# Patient Record
Sex: Female | Born: 2000
Health system: Southern US, Community
[De-identification: ages and names within clinical notes are randomized; demographics above are authoritative.]

---
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
## 2019-04-01 DIAGNOSIS — Z114 Encounter for screening for human immunodeficiency virus [HIV]: Secondary | ICD-10-CM | POA: Diagnosis not present

## 2019-04-01 DIAGNOSIS — Z01419 Encounter for gynecological examination (general) (routine) without abnormal findings: Secondary | ICD-10-CM | POA: Diagnosis not present

## 2019-04-01 DIAGNOSIS — Z1159 Encounter for screening for other viral diseases: Secondary | ICD-10-CM | POA: Diagnosis not present

## 2019-04-01 DIAGNOSIS — N898 Other specified noninflammatory disorders of vagina: Secondary | ICD-10-CM | POA: Diagnosis not present

## 2019-04-01 DIAGNOSIS — Z6828 Body mass index (BMI) 28.0-28.9, adult: Secondary | ICD-10-CM | POA: Diagnosis not present

## 2019-04-01 DIAGNOSIS — Z113 Encounter for screening for infections with a predominantly sexual mode of transmission: Secondary | ICD-10-CM | POA: Diagnosis not present

## 2019-04-01 DIAGNOSIS — Z118 Encounter for screening for other infectious and parasitic diseases: Secondary | ICD-10-CM | POA: Diagnosis not present

## 2019-04-15 DIAGNOSIS — Z3042 Encounter for surveillance of injectable contraceptive: Secondary | ICD-10-CM | POA: Diagnosis not present

## 2019-07-07 DIAGNOSIS — Z3042 Encounter for surveillance of injectable contraceptive: Secondary | ICD-10-CM | POA: Diagnosis not present

## 2019-08-01 ENCOUNTER — Other Ambulatory Visit: Payer: Self-pay

## 2019-08-01 ENCOUNTER — Encounter (HOSPITAL_COMMUNITY): Payer: Self-pay

## 2019-08-01 ENCOUNTER — Ambulatory Visit (HOSPITAL_COMMUNITY)
Admission: EM | Admit: 2019-08-01 | Discharge: 2019-08-01 | Disposition: A | Discharge status: Home / Self Care | Payer: BC Managed Care – PPO | Attending: Emergency Medicine | Admitting: Emergency Medicine

## 2019-08-01 DIAGNOSIS — R3989 Other symptoms and signs involving the genitourinary system: Secondary | ICD-10-CM | POA: Diagnosis not present

## 2019-08-01 DIAGNOSIS — Z3202 Encounter for pregnancy test, result negative: Secondary | ICD-10-CM | POA: Diagnosis not present

## 2019-08-01 DIAGNOSIS — N898 Other specified noninflammatory disorders of vagina: Secondary | ICD-10-CM

## 2019-08-01 LAB — POCT URINALYSIS DIP (DEVICE)
Bilirubin Urine: NEGATIVE
Glucose, UA: NEGATIVE mg/dL
Ketones, ur: 40 mg/dL — AB
Nitrite: NEGATIVE
Protein, ur: 30 mg/dL — AB
Specific Gravity, Urine: >=1.03 (ref 1.005–1.030)
Urobilinogen, UA: 0.2 mg/dL (ref 0.0–1.0)
pH: 7 (ref 5.0–8.0)

## 2019-08-01 LAB — POC URINE PREG, ED: Preg Test, Ur: NEGATIVE

## 2019-08-01 LAB — POCT PREGNANCY, URINE: Preg Test, Ur: NEGATIVE

## 2019-08-01 MED ORDER — LIDOCAINE HCL 2 % EX GEL: 1.0000 "application " | CUTANEOUS | 0 refills | Status: DC | PRN

## 2019-08-01 MED ORDER — AZITHROMYCIN 250 MG PO TABS
1000.0000 mg | ORAL_TABLET | Freq: Once | ORAL | Status: AC
  Administered 2019-08-01: 1000 mg via ORAL

## 2019-08-01 MED ORDER — METRONIDAZOLE 500 MG PO TABS: 500.0000 mg | ORAL_TABLET | Freq: Two times a day (BID) | ORAL | 0 refills | Status: AC

## 2019-08-01 MED ORDER — AZITHROMYCIN 250 MG PO TABS
ORAL_TABLET | ORAL | Status: AC
  Filled 2019-08-01: qty 1

## 2019-08-01 MED ORDER — CEFTRIAXONE SODIUM 250 MG IJ SOLR
INTRAMUSCULAR | Status: AC
  Filled 2019-08-01: qty 250

## 2019-08-01 MED ORDER — CEFTRIAXONE SODIUM 250 MG IJ SOLR
250.0000 mg | Freq: Once | INTRAMUSCULAR | Status: AC
  Administered 2019-08-01: 250 mg via INTRAMUSCULAR

## 2019-08-01 MED ORDER — VALACYCLOVIR HCL 1 G PO TABS: 1000.0000 mg | ORAL_TABLET | Freq: Three times a day (TID) | ORAL | 0 refills | Status: AC

## 2019-08-01 NOTE — Discharge Instructions (Signed)
We have treated you today for gonorrhea and chlamydia, with Rocephin and azithromycin.  Begin taking metronidazole twice daily for the next week to treat for bacterial vaginosis as well as trichomonas.  Please refrain from sexual intercourse for 7 days while medicines eliminating infection.   Genital lesions concerning for herpes.  Begin Valtrex 3 times a day for the next 2 weeks.  Please follow-up with OB/GYN for further discussion of suppressive therapy.  We are testing you for HSV, Gonorrhea, Chlamydia, Trichomonas, Yeast and Bacterial Vaginosis. We will call you if anything is positive and let you know if you require any further treatment. Please inform partners of any positive results.   Please return if symptoms not improving with treatment, development of fever, nausea, vomiting, abdominal pain.

## 2019-08-01 NOTE — ED Triage Notes (Signed)
Pt states having painful blisters in the perineum x 4 days. Reports having light brown vaginal discharge x 2 days.

## 2019-08-02 ENCOUNTER — Telehealth (HOSPITAL_COMMUNITY): Payer: Self-pay | Admitting: Emergency Medicine

## 2019-08-02 MED ORDER — CEPHALEXIN 500 MG PO CAPS: 500.0000 mg | ORAL_CAPSULE | Freq: Two times a day (BID) | ORAL | 0 refills | Status: AC

## 2019-08-02 NOTE — ED Provider Notes (Signed)
[facility]    CSN: [id number] Arrival date & time: 08/01/19  1313      History   Chief Complaint Chief Complaint  Patient presents with  . blisters    HPI Anne Walls is a 18 y.o. female no significant past medical history presenting today for evaluation of discharge and genital blisters.  Patient states that over the past 4 days she has developed ulcers/blisters to her genital area.  States that initially she had some burning with urination and initially thought this was from a rash, but since has developed more prominent lesions that are associated with pain.  She denies increasing urinary frequency or incomplete voiding.  Over the past couple days she has also noticed a tan watery discharge.  She denies any abdominal pain nausea or vomiting.  Last menstrual cycle was November 20 6-12/2.  Patient is on birth control, uses Depo-Provera.  Last injection was December 2.  HPI  History reviewed. No pertinent past medical history.  There are no problems to display for this patient.   History reviewed. No pertinent surgical history.  OB History   No obstetric history on file.      Home Medications    Prior to Admission medications   Medication Sig Start Date End Date Taking? Authorizing Provider  medroxyPROGESTERone (DEPO-PROVERA) 150 MG/ML injection Inject 150 mg into the muscle every 3 (three) months.   Yes [provider]  lidocaine (XYLOCAINE) 2 % jelly Apply 1 application topically as needed. 08/01/19   Jaslyn Bansal C, PA-C  metroNIDAZOLE (FLAGYL) 500 MG tablet Take 1 tablet (500 mg total) by mouth 2 (two) times daily for 7 days. 08/01/19 08/08/19  Tag Wurtz C, PA-C  valACYclovir (VALTREX) 1000 MG tablet Take 1 tablet (1,000 mg total) by mouth 3 (three) times daily for 14 days. 08/01/19 08/15/19  Prabhjot Piscitello, Junius Creamer, PA-C    Family History Family History  Problem Relation Age of Onset  . Healthy Mother   . Healthy Father     Social  History Social History   Tobacco Use  . Smoking status: Never Smoker  . Smokeless tobacco: Never Used  Substance Use Topics  . Alcohol use: Never  . Drug use: Never     Allergies   Penicillins   Review of Systems Review of Systems  Constitutional: Negative for fever.  Respiratory: Negative for shortness of breath.   Cardiovascular: Negative for chest pain.  Gastrointestinal: Negative for abdominal pain, diarrhea, nausea and vomiting.  Genitourinary: Positive for dysuria, genital sores and vaginal discharge. Negative for flank pain, hematuria, menstrual problem, vaginal bleeding and vaginal pain.  Musculoskeletal: Negative for back pain.  Skin: Negative for rash.  Neurological: Negative for dizziness, light-headedness and headaches.     Physical Exam Triage Vital Signs ED Triage Vitals  Enc Vitals Group     BP 08/01/19 1425 130/80     Pulse Rate 08/01/19 1425 (!) 104     Resp 08/01/19 1425 16     Temp 08/01/19 1425 99.9 F (37.7 C)     Temp Source 08/01/19 1425 Oral     SpO2 08/01/19 1425 100 %     Weight --      Height --      Head Circumference --      Peak Flow --      Pain Score 08/01/19 1423 10     Pain Loc --      Pain Edu? --      Excl. in GC? --  No data found.  Updated Vital Signs BP 130/80 (BP Location: Right Arm)   Pulse (!) 104   Temp 99.9 F (37.7 C) (Oral)   Resp 16   SpO2 100%   Visual Acuity Right Eye Distance:   Left Eye Distance:   Bilateral Distance:    Right Eye Near:   Left Eye Near:    Bilateral Near:     Physical Exam Vitals and nursing note reviewed.  Constitutional:      General: She is not in acute distress.    Appearance: She is well-developed.  HENT:     Head: Normocephalic and atraumatic.  Eyes:     Conjunctiva/sclera: Conjunctivae normal.  Cardiovascular:     Rate and Rhythm: Normal rate and regular rhythm.     Heart sounds: No murmur.  Pulmonary:     Effort: Pulmonary effort is normal. No respiratory  distress.     Breath sounds: Normal breath sounds.  Abdominal:     Palpations: Abdomen is soft.     Tenderness: There is no abdominal tenderness.     Comments: Soft, nondistended, nontender to palpation throughout abdomen  Genitourinary:    Comments: Normal external female genitalia, multiple ulcerative lesions to perineal area, lower labia majora as well as extending into bilateral gluteal/perirectal areas  Watery brownish discharge noted in vagina, vaginal mucosa pink, cervix is slightly erythematous and friable with obtaining swab, mild discomfort with bimanual, no cervical motion tenderness, no adnexal masses palpated Musculoskeletal:     Cervical back: Neck supple.  Skin:    General: Skin is warm and dry.  Neurological:     Mental Status: She is alert.      UC Treatments / Results  Labs (all labs ordered are listed, but only abnormal results are displayed) Labs Reviewed  POCT URINALYSIS DIP (DEVICE) - Abnormal; Notable for the following components:      Result Value   Ketones, ur 40 (*)    Hgb urine dipstick SMALL (*)    Protein, ur 30 (*)    Leukocytes,Ua TRACE (*)    All other components within normal limits  HSV CULTURE AND TYPING  URINE CULTURE  POC URINE PREG, ED  POCT PREGNANCY, URINE  CERVICOVAGINAL ANCILLARY ONLY    EKG   Radiology No results found.  Procedures Procedures (including critical care time)  Medications Ordered in UC Medications  cefTRIAXone (ROCEPHIN) injection 250 mg (250 mg Intramuscular Given 08/01/19 1536)  azithromycin (ZITHROMAX) tablet 1,000 mg (1,000 mg Oral Given 08/01/19 1537)    Initial Impression / Assessment and Plan / UC Course  I have reviewed the triage vital signs and the nursing notes.  Pertinent labs & imaging results that were available during my care of the patient were reviewed by me and considered in my medical decision making (see chart for details).     Exam suggestive of likely HSV infection causing sores,  initiating on Valtrex and HSV swab pending.  Provided Xylocaine jelly to apply topically to help with discomfort.  Given presentation and also empirically treating for gonorrhea and chlamydia today and continuing on metronidazole twice daily x1 week for full coverage of STDs.  Cervicovaginal swab pending.  Will call with results and provide further treatment as needed.  Advised to follow-up with OB/GYN for further treatment/management of HSV.  Discussed strict return precautions. Patient verbalized understanding and is agreeable with plan.  Final Clinical Impressions(s) / UC Diagnoses   Final diagnoses:  Vaginal discharge  Genital sore  Discharge Instructions     We have treated you today for gonorrhea and chlamydia, with Rocephin and azithromycin.  Begin taking metronidazole twice daily for the next week to treat for bacterial vaginosis as well as trichomonas.  Please refrain from sexual intercourse for 7 days while medicines eliminating infection.   Genital lesions concerning for herpes.  Begin Valtrex 3 times a day for the next 2 weeks.  Please follow-up with OB/GYN for further discussion of suppressive therapy.  We are testing you for HSV, Gonorrhea, Chlamydia, Trichomonas, Yeast and Bacterial Vaginosis. We will call you if anything is positive and let you know if you require any further treatment. Please inform partners of any positive results.   Please return if symptoms not improving with treatment, development of fever, nausea, vomiting, abdominal pain.    ED Prescriptions    Medication Sig Dispense Auth. Provider   valACYclovir (VALTREX) 1000 MG tablet Take 1 tablet (1,000 mg total) by mouth 3 (three) times daily for 14 days. 42 tablet Borden Thune C, PA-C   metroNIDAZOLE (FLAGYL) 500 MG tablet Take 1 tablet (500 mg total) by mouth 2 (two) times daily for 7 days. 14 tablet Charnay Nazario C, PA-C   lidocaine (XYLOCAINE) 2 % jelly Apply 1 application topically as needed. 30  mL Savoy Som[place], Sierra BrooksHallie C, PA-C     PDMP not reviewed this encounter.   Lew DawesWieters, Idabell Picking C, [place]PA-C 08/02/19 [phone]

## 2019-08-02 NOTE — Telephone Encounter (Signed)
Keflex sent for positive culture of strep, twice daily for 1 week. Has unknown childhood reaction to PCN and tolerated ceftriaxone recently will proceed with cephalosporins. Vaginal swab pending.

## 2019-08-03 ENCOUNTER — Telehealth: Payer: Self-pay | Admitting: Emergency Medicine

## 2019-08-03 LAB — URINE CULTURE: Culture: >=100000 — AB

## 2019-08-03 NOTE — Telephone Encounter (Signed)
Attempted to reach patient. No answer at this time. Voicemail left.    

## 2019-08-04 LAB — CERVICOVAGINAL ANCILLARY ONLY
Bacterial vaginitis: NEGATIVE
Candida vaginitis: NEGATIVE
Chlamydia: POSITIVE — AB
Neisseria Gonorrhea: NEGATIVE
Trichomonas: NEGATIVE

## 2019-08-04 LAB — HSV CULTURE AND TYPING

## 2019-08-05 ENCOUNTER — Emergency Department (HOSPITAL_COMMUNITY)
Admission: EM | Admit: 2019-08-05 | Discharge: 2019-08-06 | Disposition: A | Discharge status: Home / Self Care | Payer: BC Managed Care – PPO | Attending: Emergency Medicine | Admitting: Emergency Medicine

## 2019-08-05 ENCOUNTER — Telehealth: Payer: Self-pay | Admitting: Emergency Medicine

## 2019-08-05 ENCOUNTER — Encounter (HOSPITAL_COMMUNITY): Payer: Self-pay | Admitting: Emergency Medicine

## 2019-08-05 ENCOUNTER — Other Ambulatory Visit: Payer: Self-pay

## 2019-08-05 ENCOUNTER — Emergency Department (HOSPITAL_COMMUNITY): Payer: BC Managed Care – PPO

## 2019-08-05 DIAGNOSIS — M25531 Pain in right wrist: Secondary | ICD-10-CM | POA: Diagnosis not present

## 2019-08-05 DIAGNOSIS — M79641 Pain in right hand: Secondary | ICD-10-CM | POA: Diagnosis not present

## 2019-08-05 DIAGNOSIS — Y999 Unspecified external cause status: Secondary | ICD-10-CM | POA: Diagnosis not present

## 2019-08-05 DIAGNOSIS — S0083XA Contusion of other part of head, initial encounter: Secondary | ICD-10-CM | POA: Diagnosis not present

## 2019-08-05 DIAGNOSIS — Y929 Unspecified place or not applicable: Secondary | ICD-10-CM | POA: Diagnosis not present

## 2019-08-05 DIAGNOSIS — Y939 Activity, unspecified: Secondary | ICD-10-CM | POA: Diagnosis not present

## 2019-08-05 DIAGNOSIS — R600 Localized edema: Secondary | ICD-10-CM | POA: Diagnosis not present

## 2019-08-05 DIAGNOSIS — S60211A Contusion of right wrist, initial encounter: Secondary | ICD-10-CM | POA: Diagnosis not present

## 2019-08-05 DIAGNOSIS — S0990XA Unspecified injury of head, initial encounter: Secondary | ICD-10-CM | POA: Diagnosis not present

## 2019-08-05 DIAGNOSIS — Z79899 Other long term (current) drug therapy: Secondary | ICD-10-CM | POA: Diagnosis not present

## 2019-08-05 DIAGNOSIS — S6991XA Unspecified injury of right wrist, hand and finger(s), initial encounter: Secondary | ICD-10-CM | POA: Diagnosis not present

## 2019-08-05 DIAGNOSIS — S60221A Contusion of right hand, initial encounter: Secondary | ICD-10-CM | POA: Diagnosis not present
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
## 2019-08-05 NOTE — Telephone Encounter (Signed)
Chlamydia is positive.  This was treated at the urgent care visit with po zithromax 1g.  Pt needs education to please refrain from sexual intercourse for 7 days to give the medicine time to work.  Sexual partners need to be notified and tested/treated.  Condoms may reduce risk of reinfection.  Recheck or followup with PCP for further evaluation if symptoms are not improving.  [facility] notified.  HSV culture shows type 1. Pt given valtrex  Pt c/o burning with urination still, informed her of medicine at pharmacy. Patient contacted by phone and made aware of    results. Pt verbalized understanding and had all questions answered.

## 2019-08-06 ENCOUNTER — Emergency Department (HOSPITAL_COMMUNITY): Payer: BC Managed Care – PPO

## 2019-08-06 DIAGNOSIS — S0990XA Unspecified injury of head, initial encounter: Secondary | ICD-10-CM | POA: Diagnosis not present

## 2019-08-06 DIAGNOSIS — S60221A Contusion of right hand, initial encounter: Secondary | ICD-10-CM | POA: Diagnosis not present

## 2019-08-06 DIAGNOSIS — S6991XA Unspecified injury of right wrist, hand and finger(s), initial encounter: Secondary | ICD-10-CM | POA: Diagnosis not present

## 2019-08-06 DIAGNOSIS — M79641 Pain in right hand: Secondary | ICD-10-CM | POA: Diagnosis not present

## 2019-08-06 MED ORDER — IBUPROFEN 200 MG PO TABS
600.0000 mg | ORAL_TABLET | Freq: Once | ORAL | Status: AC
  Administered 2019-08-06: 600 mg via ORAL
  Filled 2019-08-06: qty 1
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
## 2019-10-05 DIAGNOSIS — Z3042 Encounter for surveillance of injectable contraceptive: Secondary | ICD-10-CM | POA: Diagnosis not present

## 2019-12-31 DIAGNOSIS — Z3042 Encounter for surveillance of injectable contraceptive: Secondary | ICD-10-CM | POA: Diagnosis not present

## 2020-03-02 ENCOUNTER — Emergency Department (HOSPITAL_COMMUNITY): Payer: BC Managed Care – PPO

## 2020-03-02 ENCOUNTER — Encounter (HOSPITAL_COMMUNITY): Payer: Self-pay

## 2020-03-02 ENCOUNTER — Emergency Department (HOSPITAL_COMMUNITY)
Admission: EM | Admit: 2020-03-02 | Discharge: 2020-03-02 | Disposition: A | Discharge status: Home / Self Care | Payer: BC Managed Care – PPO | Attending: Emergency Medicine | Admitting: Emergency Medicine

## 2020-03-02 DIAGNOSIS — Y999 Unspecified external cause status: Secondary | ICD-10-CM | POA: Insufficient documentation

## 2020-03-02 DIAGNOSIS — Y929 Unspecified place or not applicable: Secondary | ICD-10-CM | POA: Diagnosis not present

## 2020-03-02 DIAGNOSIS — Y939 Activity, unspecified: Secondary | ICD-10-CM | POA: Diagnosis not present

## 2020-03-02 DIAGNOSIS — S0181XA Laceration without foreign body of other part of head, initial encounter: Secondary | ICD-10-CM | POA: Insufficient documentation

## 2020-03-02 DIAGNOSIS — Z23 Encounter for immunization: Secondary | ICD-10-CM | POA: Insufficient documentation

## 2020-03-02 DIAGNOSIS — J322 Chronic ethmoidal sinusitis: Secondary | ICD-10-CM | POA: Diagnosis not present

## 2020-03-02 DIAGNOSIS — W010XXA Fall on same level from slipping, tripping and stumbling without subsequent striking against object, initial encounter: Secondary | ICD-10-CM | POA: Insufficient documentation

## 2020-03-02 DIAGNOSIS — J0120 Acute ethmoidal sinusitis, unspecified: Secondary | ICD-10-CM | POA: Diagnosis not present

## 2020-03-02 DIAGNOSIS — W19XXXA Unspecified fall, initial encounter: Secondary | ICD-10-CM | POA: Diagnosis not present

## 2020-03-02 DIAGNOSIS — Z79899 Other long term (current) drug therapy: Secondary | ICD-10-CM | POA: Insufficient documentation

## 2020-03-02 DIAGNOSIS — J320 Chronic maxillary sinusitis: Secondary | ICD-10-CM | POA: Diagnosis not present

## 2020-03-02 DIAGNOSIS — I10 Essential (primary) hypertension: Secondary | ICD-10-CM | POA: Diagnosis not present

## 2020-03-02 DIAGNOSIS — S0993XA Unspecified injury of face, initial encounter: Secondary | ICD-10-CM | POA: Diagnosis not present

## 2020-03-02 DIAGNOSIS — R58 Hemorrhage, not elsewhere classified: Secondary | ICD-10-CM | POA: Diagnosis not present

## 2020-03-02 MED ORDER — ACETAMINOPHEN 325 MG PO TABS
650.0000 mg | ORAL_TABLET | Freq: Once | ORAL | Status: AC
  Administered 2020-03-02: 650 mg via ORAL
  Filled 2020-03-02: qty 2

## 2020-03-02 MED ORDER — TETANUS-DIPHTH-ACELL PERTUSSIS 5-2.5-18.5 LF-MCG/0.5 IM SUSP
0.5000 mL | Freq: Once | INTRAMUSCULAR | Status: AC
Start: 2020-03-02 — End: 2020-03-02
  Administered 2020-03-02: 0.5 mL via INTRAMUSCULAR
  Filled 2020-03-02: qty 0.5
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
## 2020-03-02 NOTE — ED Notes (Signed)
Pt discharged from this ED in stable condition at this time. All discharge instructions and follow up care reviewed with pt with no further questions at this time. Pt ambulatory with steady gait, clear speech.  

## 2020-03-24 DIAGNOSIS — Z3042 Encounter for surveillance of injectable contraceptive: Secondary | ICD-10-CM | POA: Diagnosis not present

## 2020-03-25 DIAGNOSIS — L910 Hypertrophic scar: Secondary | ICD-10-CM | POA: Diagnosis not present

## 2020-04-09 DIAGNOSIS — L089 Local infection of the skin and subcutaneous tissue, unspecified: Secondary | ICD-10-CM | POA: Diagnosis not present

## 2020-04-09 DIAGNOSIS — L239 Allergic contact dermatitis, unspecified cause: Secondary | ICD-10-CM | POA: Diagnosis not present

## 2020-05-30 ENCOUNTER — Institutional Professional Consult (permissible substitution): Payer: BC Managed Care – PPO | Admitting: Plastic Surgery

## 2020-08-16 ENCOUNTER — Other Ambulatory Visit: Payer: BC Managed Care – PPO
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
## 2023-04-10 ENCOUNTER — Encounter (HOSPITAL_COMMUNITY): Payer: Self-pay

## 2023-04-10 ENCOUNTER — Ambulatory Visit (HOSPITAL_COMMUNITY)
Admission: EM | Admit: 2023-04-10 | Discharge: 2023-04-10 | Disposition: A | Discharge status: Home / Self Care | Payer: Managed Care, Other (non HMO) | Attending: Emergency Medicine | Admitting: Emergency Medicine

## 2023-04-10 DIAGNOSIS — L0501 Pilonidal cyst with abscess: Secondary | ICD-10-CM

## 2023-04-10 MED ORDER — IBUPROFEN 800 MG PO TABS: 800.0000 mg | ORAL_TABLET | Freq: Three times a day (TID) | ORAL | 0 refills | Status: AC

## 2023-04-10 MED ORDER — SULFAMETHOXAZOLE-TRIMETHOPRIM 800-160 MG PO TABS
1.0000 | ORAL_TABLET | Freq: Two times a day (BID) | ORAL | 0 refills | Status: DC
Start: 2023-04-10 — End: 2023-04-12

## 2023-04-10 MED ORDER — CHLORHEXIDINE GLUCONATE 4 % EX SOLN: Freq: Every day | CUTANEOUS | 0 refills | Status: AC | PRN

## 2023-04-10 NOTE — ED Provider Notes (Signed)
[facility]    CSN: [id number] Arrival date & time: 04/10/23  1039      History   Chief Complaint Chief Complaint  Patient presents with   Abscess    HPI Anne Walls is a 22 y.o. female.   Patient presents to clinic over concerns of a possible rectal abscess.  She noticed Friday that she had some discomfort with the gluteal cleft, send today the pain got much worse.  She noticed a hard firm area that she tried to drain herself, did not get any drainage from it.  She has been taken Tylenol as needed for pain.  Denies fevers, nausea or vomiting.  No drainage from the site.  Denies any history of abscesses to this area, or ever needing an incision and drainage.    The history is provided by the patient and medical records.  Abscess Associated symptoms: no fever, no nausea and no vomiting     History reviewed. No pertinent past medical history.  There are no problems to display for this patient.   History reviewed. No pertinent surgical history.  OB History   No obstetric history on file.      Home Medications    Prior to Admission medications   Medication Sig Start Date End Date Taking? Authorizing Provider  chlorhexidine (HIBICLENS) 4 % external liquid Apply topically daily as needed. 04/10/23  Yes Rinaldo Ratel, [place] N, FNP  ibuprofen (ADVIL) 800 MG tablet Take 1 tablet (800 mg total) by mouth 3 (three) times daily. 04/10/23  Yes Rinaldo Ratel, [place] N, FNP  sulfamethoxazole-trimethoprim (BACTRIM DS) 800-160 MG tablet Take 1 tablet by mouth 2 (two) times daily for 7 days. 04/10/23 04/17/23 Yes Rinaldo Ratel, [place] N, FNP  medroxyPROGESTERone (DEPO-PROVERA) 150 MG/ML injection Inject 150 mg into the muscle every 3 (three) months.    [provider]    Family History Family History  Problem Relation Age of Onset   Healthy Mother    Healthy Father     Social History Social History   Tobacco Use   Smoking status: Never   Smokeless tobacco: Never   Substance Use Topics   Alcohol use: Never   Drug use: Never     Allergies   Penicillins   Review of Systems Review of Systems  Constitutional:  Negative for fever.  Gastrointestinal:  Negative for nausea and vomiting.     Physical Exam Triage Vital Signs ED Triage Vitals  Encounter Vitals Group     BP 04/10/23 1148 125/79     Systolic BP Percentile --      Diastolic BP Percentile --      Pulse Rate 04/10/23 1148 99     Resp 04/10/23 1148 18     Temp 04/10/23 1148 98.6 F (37 C)     Temp Source 04/10/23 1148 Oral     SpO2 04/10/23 1148 98 %     Weight --      Height --      Head Circumference --      Peak Flow --      Pain Score 04/10/23 1150 10     Pain Loc --      Pain Education --      Exclude from Growth Chart --    No data found.  Updated Vital Signs BP 125/79 (BP Location: Right Arm)   Pulse 99   Temp 98.6 F (37 C) (Oral)   Resp 18   SpO2 98%   Visual Acuity Right Eye  Distance:   Left Eye Distance:   Bilateral Distance:    Right Eye Near:   Left Eye Near:    Bilateral Near:     Physical Exam Vitals and nursing note reviewed.  Constitutional:      Appearance: Normal appearance.  HENT:     Head: Normocephalic and atraumatic.     Right Ear: External ear normal.     Left Ear: External ear normal.     Nose: Nose normal.     Mouth/Throat:     Mouth: Mucous membranes are moist.  Eyes:     Conjunctiva/sclera: Conjunctivae normal.  Cardiovascular:     Rate and Rhythm: Normal rate.  Pulmonary:     Effort: Pulmonary effort is normal. No respiratory distress.  Genitourinary:      Comments: Indurated abscess to left side of gluteal cleft.  No drainage.  Area is tender to palpation, no erythema noted. Musculoskeletal:        General: Normal range of motion.  Skin:    General: Skin is warm and dry.  Neurological:     General: No focal deficit present.     Mental Status: She is alert and oriented to person, place, and time.  Psychiatric:         Mood and Affect: Mood normal.        Behavior: Behavior normal. Behavior is cooperative.      UC Treatments / Results  Labs (all labs ordered are listed, but only abnormal results are displayed) Labs Reviewed - No data to display  EKG   Radiology No results found.  Procedures Procedures (including critical care time)  Medications Ordered in UC Medications - No data to display  Initial Impression / Assessment and Plan / UC Course  I have reviewed the triage vital signs and the nursing notes.  Pertinent labs & imaging results that were available during my care of the patient were reviewed by me and considered in my medical decision making (see chart for details).  Vitals and triage reviewed, patient is hemodynamically stable.  Has indurated abscess to the left side of gluteal cleft, without fluctuance or erythema.  Afebrile, without nausea or vomiting, low concern for systemic infection.  Abscess does not appear to be ready for incision and drainage at this time.  Will place on oral antibiotics, encouraged warm compresses, and follow-up with clinic if abscess does not resolve with conservative measures for potential reevaluation and incision and drainage.  Plan of care, follow-up care and return precautions given, no questions at this time.     Final Clinical Impressions(s) / UC Diagnoses   Final diagnoses:  Pilonidal abscess     Discharge Instructions      You have an abscess at the base of your toe without.  Please take all oral antibiotics as prescribed and until finished, you can take them with food to help prevent gastrointestinal upset.  For pain and inflammation you can take 800 mg of ibuprofen 3 times daily, I would also take this with food.  Please do warm compresses to the site with the antibacterial solution, Hibiclens, 2-3 times daily for 10 to 15 minutes each.  After the abscess is resolved you can wash her body with Hibiclens daily to help prevent  bacterial infections.  If your abscess does not improve over the next 72 hours on antibiotics, or does not resolve despite finishing antibiotics, please return to clinic as we may need to reevaluate this and perform an incision and drainage.  ED Prescriptions     Medication Sig Dispense Auth. Provider   sulfamethoxazole-trimethoprim (BACTRIM DS) 800-160 MG tablet Take 1 tablet by mouth 2 (two) times daily for 7 days. 14 tablet Rinaldo Ratel, [place] N, [place]   chlorhexidine (HIBICLENS) 4 % external liquid Apply topically daily as needed. 118 mL Rinaldo Ratel, [place] N, [place]   ibuprofen (ADVIL) 800 MG tablet Take 1 tablet (800 mg total) by mouth 3 (three) times daily. 21 tablet Deronda Christian, [place] N, [place]      PDMP not reviewed this encounter.   Jayshaun Phillips, [place] N, [place] 04/10/23 1229

## 2023-04-10 NOTE — Discharge Instructions (Addendum)
You have an abscess at the base of your toe without.  Please take all oral antibiotics as prescribed and until finished, you can take them with food to help prevent gastrointestinal upset.  For pain and inflammation you can take 800 mg of ibuprofen 3 times daily, I would also take this with food.  Please do warm compresses to the site with the antibacterial solution, Hibiclens, 2-3 times daily for 10 to 15 minutes each.  After the abscess is resolved you can wash her body with Hibiclens daily to help prevent bacterial infections.  If your abscess does not improve over the next 72 hours on antibiotics, or does not resolve despite finishing antibiotics, please return to clinic as we may need to reevaluate this and perform an incision and drainage.

## 2023-04-10 NOTE — ED Triage Notes (Signed)
Pt c/o abscess to top of buttocks since Friday. Denies drainage.

## 2023-04-12 ENCOUNTER — Ambulatory Visit (HOSPITAL_COMMUNITY)
Admission: EM | Admit: 2023-04-12 | Discharge: 2023-04-12 | Disposition: A | Discharge status: Home / Self Care | Payer: Managed Care, Other (non HMO) | Attending: Family Medicine | Admitting: Family Medicine

## 2023-04-12 ENCOUNTER — Encounter (HOSPITAL_COMMUNITY): Payer: Self-pay

## 2023-04-12 DIAGNOSIS — L0501 Pilonidal cyst with abscess: Secondary | ICD-10-CM | POA: Diagnosis not present

## 2023-04-12 MED ORDER — LIDOCAINE-EPINEPHRINE 1 %-1:100000 IJ SOLN
INTRAMUSCULAR | Status: AC
  Filled 2023-04-12: qty 1

## 2023-04-12 MED ORDER — LIDOCAINE-EPINEPHRINE-TETRACAINE (LET) TOPICAL GEL
3.0000 mL | Freq: Once | TOPICAL | Status: AC
  Administered 2023-04-12: 3 mL via TOPICAL

## 2023-04-12 MED ORDER — LIDOCAINE-EPINEPHRINE-TETRACAINE (LET) TOPICAL GEL
TOPICAL | Status: AC
  Filled 2023-04-12: qty 3

## 2023-04-12 MED ORDER — LIDOCAINE HCL (PF) 1 % IJ SOLN
INTRAMUSCULAR | Status: AC
  Filled 2023-04-12: qty 30

## 2023-04-12 MED ORDER — SULFAMETHOXAZOLE-TRIMETHOPRIM 800-160 MG PO TABS: 1.0000 | ORAL_TABLET | Freq: Two times a day (BID) | ORAL | 0 refills | Status: AC

## 2023-04-12 NOTE — Discharge Instructions (Signed)
Complete current course of antibiotics and I have extended antibiotics an additional 3 days-pick up from pharmacy. As discussed where a menstrual pad to collect excess drainage from abscess. Continue chlorhexidine warm baths to help prevent infection and improve resolution of abscess.  Return as needed or return if any concerns develop.

## 2023-04-12 NOTE — ED Provider Notes (Signed)
[facility]    CSN: [id number] Arrival date & time: 04/12/23  0803      History   Chief Complaint Chief Complaint  Patient presents with   Abscess    HPI Anne Walls is a 22 y.o. female.   Patient presents today with infected pilonidal cyst which is painful, non draining, and poorly responding to antibiotic treatment. Patient was seen here in clinic on 04/10/2023,  abscess was managed with antibiotic which patient endorses has not helped. Today area is more painful and she noticed scant about of purulent drainage from site. History reviewed. No pertinent past medical history.  There are no problems to display for this patient.   History reviewed. No pertinent surgical history.  OB History   No obstetric history on file.      Home Medications    Prior to Admission medications   Medication Sig Start Date End Date Taking? Authorizing Provider  chlorhexidine (HIBICLENS) 4 % external liquid Apply topically daily as needed. 04/10/23   Garrison, [place] N, FNP  ibuprofen (ADVIL) 800 MG tablet Take 1 tablet (800 mg total) by mouth 3 (three) times daily. 04/10/23   Garrison, [place] N, FNP  medroxyPROGESTERone (DEPO-PROVERA) 150 MG/ML injection Inject 150 mg into the muscle every 3 (three) months.    [provider]  sulfamethoxazole-trimethoprim (BACTRIM DS) 800-160 MG tablet Take 1 tablet by mouth 2 (two) times daily for 3 days. 04/12/23 04/15/23  Bing Neighbors, NP    Family History Family History  Problem Relation Age of Onset   Healthy Mother    Healthy Father     Social History Social History   Tobacco Use   Smoking status: Never   Smokeless tobacco: Never  Substance Use Topics   Alcohol use: Never   Drug use: Never     Allergies   Penicillins   Review of Systems Review of Systems Pertinent negatives listed in HPI  Physical Exam Triage Vital Signs ED Triage Vitals  Encounter Vitals Group     BP 04/12/23 0826 123/73      Systolic BP Percentile --      Diastolic BP Percentile --      Pulse Rate 04/12/23 0826 87     Resp 04/12/23 0826 18     Temp 04/12/23 0826 98.3 F (36.8 C)     Temp Source 04/12/23 0826 Oral     SpO2 04/12/23 0826 98 %     Weight --      Height --      Head Circumference --      Peak Flow --      Pain Score 04/12/23 0827 10     Pain Loc --      Pain Education --      Exclude from Growth Chart --    No data found.  Updated Vital Signs BP 123/73 (BP Location: Right Arm)   Pulse 87   Temp 98.3 F (36.8 C) (Oral)   Resp 18   SpO2 98%   Visual Acuity Right Eye Distance:   Left Eye Distance:   Bilateral Distance:    Right Eye Near:   Left Eye Near:    Bilateral Near:     Physical Exam Vitals reviewed. Exam conducted with a chaperone present.  Constitutional:      Appearance: Normal appearance.  HENT:     Head: Normocephalic and atraumatic.  Eyes:     Extraocular Movements: Extraocular movements intact.     Conjunctiva/sclera:  Conjunctivae normal.     Pupils: Pupils are equal, round, and reactive to light.  Cardiovascular:     Rate and Rhythm: Normal rate and regular rhythm.  Genitourinary:   Skin:    General: Skin is warm.  Neurological:     General: No focal deficit present.     Mental Status: She is alert and oriented to person, place, and time.     UC Treatments / Results  Labs (all labs ordered are listed, but only abnormal results are displayed) Labs Reviewed - No data to display  EKG   Radiology No results found.  Procedures Incision and Drainage  Date/Time: 04/12/2023 9:51 AM  Performed by: Bing Neighbors, NP Authorized by: Bing Neighbors, NP   Consent:    Consent obtained:  Verbal   Consent given by:  Patient   Risks discussed:  Bleeding and incomplete drainage   Alternatives discussed:  No treatment Universal protocol:    Patient identity confirmed:  Verbally with patient Location:    Type:  Pilonidal cyst   Location:   Anogenital   Anogenital location:  Pilonidal Pre-procedure details:    Skin preparation:  Povidone-iodine Sedation:    Sedation type:  None Anesthesia:    Anesthesia method:  Topical application and local infiltration   Topical anesthetic:  LET   Local anesthetic:  Lidocaine 1% WITH epi Procedure type:    Complexity:  Complex Procedure details:    Ultrasound guidance: no     Incision types:  Stab incision   Incision depth:  Subcutaneous   Wound management:  Irrigated with saline   Drainage:  Purulent   Drainage amount:  Copious   Wound treatment:  Wound left open Post-procedure details:    Procedure completion:  Tolerated  (including critical care time)  Medications Ordered in UC Medications  lidocaine-EPINEPHrine-tetracaine (LET) topical gel (3 mLs Topical Given 04/12/23 0916)    Initial Impression / Assessment and Plan / UC Course  I have reviewed the triage vital signs and the nursing notes.  Pertinent labs & imaging results that were available during my care of the patient were reviewed by me and considered in my medical decision making (see chart for details).    Pilonidal abscess I&D successfully performed.  Patient tolerated procedure.  Will extend antibiotics from 7 days to 10 days.  Patient will continue pain management with ibuprofen.  Wound care instructions discussed.  Return precautions given. Final Clinical Impressions(s) / UC Diagnoses   Final diagnoses:  Pilonidal abscess     Discharge Instructions      Complete current course of antibiotics and I have extended antibiotics an additional 3 days-pick up from pharmacy. As discussed where a menstrual pad to collect excess drainage from abscess. Continue chlorhexidine warm baths to help prevent infection and improve resolution of abscess.  Return as needed or return if any concerns develop.   ED Prescriptions     Medication Sig Dispense Auth. Provider   sulfamethoxazole-trimethoprim (BACTRIM DS) 800-160  MG tablet Take 1 tablet by mouth 2 (two) times daily for 3 days. 6 tablet Bing Neighbors, NP      PDMP not reviewed this encounter.   Bing Neighbors, NP 04/12/23 1000

## 2023-04-12 NOTE — ED Triage Notes (Signed)
Pt c/o abscess to upper buttocks x1wk. States was tx'd here on Tuesday with antibiotics and ibuprofen. States pain is worse and has little drainage now.

## 2024-02-27 ENCOUNTER — Ambulatory Visit (HOSPITAL_COMMUNITY): Payer: Self-pay

## 2024-02-27 ENCOUNTER — Encounter (HOSPITAL_COMMUNITY): Payer: Self-pay

## 2024-02-27 ENCOUNTER — Ambulatory Visit (HOSPITAL_COMMUNITY)
Admission: RE | Admit: 2024-02-27 | Discharge: 2024-02-27 | Disposition: A | Discharge status: Home / Self Care | Source: Ambulatory Visit | Attending: Emergency Medicine | Admitting: Emergency Medicine

## 2024-02-27 VITALS — BP 134/90 | HR 65 | Temp 98.4°F | Resp 16

## 2024-02-27 DIAGNOSIS — T148XXA Other injury of unspecified body region, initial encounter: Secondary | ICD-10-CM | POA: Diagnosis not present

## 2024-02-27 DIAGNOSIS — Z789 Other specified health status: Secondary | ICD-10-CM | POA: Diagnosis not present

## 2024-02-27 DIAGNOSIS — L089 Local infection of the skin and subcutaneous tissue, unspecified: Secondary | ICD-10-CM

## 2024-02-27 MED ORDER — MUPIROCIN 2 % EX OINT: 1.0000 | TOPICAL_OINTMENT | Freq: Two times a day (BID) | CUTANEOUS | 0 refills | Status: AC

## 2024-02-27 MED ORDER — SULFAMETHOXAZOLE-TRIMETHOPRIM 800-160 MG PO TABS: 1.0000 | ORAL_TABLET | Freq: Two times a day (BID) | ORAL | 0 refills | Status: AC

## 2024-02-27 NOTE — ED Triage Notes (Signed)
 Pt states she has a dermal piercing on her neck she thinks is infected and she would like us  to remove it. It has been bothering her since Monday. Cleaned area with peroxide.

## 2024-02-27 NOTE — Discharge Instructions (Signed)
 Start taking Bactrim  twice daily for 5 days for infection coverage. Apply mupirocin  ointment twice daily to the area for additional infection prevention. Keep the area clean dry and covered. Return here if you notice any swelling, spreading of redness, or loss of puslike drainage from the area.
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
## 2024-03-19 ENCOUNTER — Ambulatory Visit (HOSPITAL_COMMUNITY): Payer: Self-pay
